# Patient Record
Sex: Male | Born: 1950 | Race: White | Hispanic: No | Marital: Married | Smoking: Never smoker
Health system: Southern US, Community
[De-identification: ages and names within clinical notes are randomized; demographics above are authoritative.]

## PROBLEM LIST (undated history)

## (undated) DIAGNOSIS — E7800 Pure hypercholesterolemia, unspecified: Secondary | ICD-10-CM

## (undated) DIAGNOSIS — I4891 Unspecified atrial fibrillation: Secondary | ICD-10-CM

## (undated) DIAGNOSIS — I2699 Other pulmonary embolism without acute cor pulmonale: Secondary | ICD-10-CM

## (undated) DIAGNOSIS — M109 Gout, unspecified: Secondary | ICD-10-CM

## (undated) DIAGNOSIS — E119 Type 2 diabetes mellitus without complications: Secondary | ICD-10-CM

## (undated) DIAGNOSIS — I10 Essential (primary) hypertension: Secondary | ICD-10-CM

## (undated) DIAGNOSIS — G629 Polyneuropathy, unspecified: Secondary | ICD-10-CM

## (undated) HISTORY — PX: CARDIAC SURGERY: SHX584

---

## 2017-10-29 ENCOUNTER — Ambulatory Visit (INDEPENDENT_AMBULATORY_CARE_PROVIDER_SITE_OTHER): Payer: Medicare HMO

## 2017-10-29 ENCOUNTER — Ambulatory Visit
Admission: EM | Admit: 2017-10-29 | Discharge: 2017-10-29 | Disposition: A | Discharge status: Home / Self Care | Payer: Medicare HMO | Attending: Emergency Medicine | Admitting: Emergency Medicine

## 2017-10-29 ENCOUNTER — Other Ambulatory Visit: Payer: Self-pay

## 2017-10-29 ENCOUNTER — Encounter: Payer: Self-pay | Admitting: Emergency Medicine

## 2017-10-29 DIAGNOSIS — R05 Cough: Secondary | ICD-10-CM

## 2017-10-29 DIAGNOSIS — J22 Unspecified acute lower respiratory infection: Secondary | ICD-10-CM | POA: Diagnosis not present

## 2017-10-29 HISTORY — DX: Other pulmonary embolism without acute cor pulmonale: I26.99

## 2017-10-29 HISTORY — DX: Gout, unspecified: M10.9

## 2017-10-29 HISTORY — DX: Polyneuropathy, unspecified: G62.9

## 2017-10-29 HISTORY — DX: Essential (primary) hypertension: I10

## 2017-10-29 HISTORY — DX: Unspecified atrial fibrillation: I48.91

## 2017-10-29 HISTORY — DX: Pure hypercholesterolemia, unspecified: E78.00

## 2017-10-29 HISTORY — DX: Type 2 diabetes mellitus without complications: E11.9

## 2017-10-29 MED ORDER — HYDROCOD POLST-CPM POLST ER 10-8 MG/5ML PO SUER: 5.0000 mL | Freq: Two times a day (BID) | ORAL | 0 refills | Status: AC | PRN

## 2017-10-29 MED ORDER — BENZONATATE 200 MG PO CAPS: 200.0000 mg | ORAL_CAPSULE | Freq: Three times a day (TID) | ORAL | 0 refills | Status: AC | PRN

## 2017-10-29 MED ORDER — AEROCHAMBER PLUS MISC: 2 refills | Status: AC

## 2017-10-29 MED ORDER — ALBUTEROL SULFATE HFA 108 (90 BASE) MCG/ACT IN AERS: 2.0000 | INHALATION_SPRAY | RESPIRATORY_TRACT | 0 refills | Status: AC | PRN

## 2017-10-29 MED ORDER — DOXYCYCLINE HYCLATE 100 MG PO CAPS: 100.0000 mg | ORAL_CAPSULE | Freq: Two times a day (BID) | ORAL | 0 refills | Status: AC

## 2017-10-29 NOTE — ED Provider Notes (Signed)
HPI  SUBJECTIVE:  Tim Roberts is a 66 y.o. male who presents with a cold and a nonproductive cough in the past 7-10 days.  Patient states that he cannot get the phlegm out.  Reports chills, but no documented fevers.  Some wheezing.  He reports shortness of breath with coughing only and chest congestion.  No nasal congestion, rhinorrhea, postnasal drip, sinus pain or pressure.  No chest pain.  No dyspnea on exertion.  No posttussive emesis.  No calf pain, new or different leg swelling.  No PND.  No orthopnea, nocturia.  States he is waking up at night coughing.  He has tried saline nasal spray, Tylenol, DayQuil and Delsym without improvement in symptoms.  No aggravating factors.  He is currently being treated for shingles on his right torso. No  other antibiotics in the past month.  He took Tylenol within 6-8 hours of evaluation.  He has a past medical history of provoked DVT/PE, diabetes, hypertension, atrial fibrillation, hypercholesterolemia.  States that he is compliant with his warfarin.  No history of CHF, asthma, eczema, COPD, smoking.  PMD: CP   Past Medical History:  Diagnosis Date  . Atrial fibrillation (HCC)   . Diabetes mellitus without complication (HCC)   . Gout   . Hypercholesteremia   . Hypertension   . Neuropathy   . Pulmonary embolism [facility])     Past Surgical History:  Procedure Laterality Date  . CARDIAC SURGERY      History reviewed. No pertinent family history.  Social History   Tobacco Use  . Smoking status: Never Smoker  . Smokeless tobacco: Never Used  Substance Use Topics  . Alcohol use: Yes    Frequency: Never  . Drug use: No    No current facility-administered medications for this encounter.   Current Outpatient Medications:  .  amiodarone (PACERONE) 100 MG tablet, Take 100 mg by mouth 2 (two) times daily., Disp: , Rfl:  .  aspirin EC 81 MG tablet, Take 81 mg by mouth daily., Disp: , Rfl:  .  atorvastatin (LIPITOR) 20 MG tablet, Take 20 mg by mouth  daily., Disp: , Rfl:  .  b complex vitamins capsule, Take 1 capsule by mouth daily., Disp: , Rfl:  .  Cholecalciferol (VITAMIN D3) 5000 units CAPS, Take 1 capsule by mouth daily., Disp: , Rfl:  .  colchicine 0.6 MG tablet, Take 0.6 mg by mouth 3 (three) times daily., Disp: , Rfl:  .  furosemide (LASIX) 40 MG tablet, Take 40 mg by mouth daily., Disp: , Rfl:  .  gabapentin (NEURONTIN) 400 MG capsule, Take 400 mg by mouth 3 (three) times daily., Disp: , Rfl:  .  metoprolol tartrate (LOPRESSOR) 50 MG tablet, Take 50 mg by mouth 2 (two) times daily., Disp: , Rfl:  .  Omega-3 Fatty Acids (FISH OIL) 1200 MG CAPS, Take 1 capsule by mouth 2 (two) times daily., Disp: , Rfl:  .  warfarin (COUMADIN) 2 MG tablet, Take 2 mg by mouth as directed., Disp: , Rfl:  .  albuterol (PROVENTIL HFA;VENTOLIN HFA) 108 (90 Base) MCG/ACT inhaler, Inhale 2 puffs into the lungs every 4 (four) hours as needed for wheezing or shortness of breath. Dispense with aerochamber, Disp: 1 Inhaler, Rfl: 0 .  benzonatate (TESSALON) 200 MG capsule, Take 1 capsule (200 mg total) by mouth 3 (three) times daily as needed for cough., Disp: 30 capsule, Rfl: 0 .  chlorpheniramine-HYDROcodone (TUSSIONEX PENNKINETIC ER) 10-8 MG/5ML SUER, Take 5 mLs by mouth every  12 (twelve) hours as needed for cough., Disp: 120 mL, Rfl: 0 .  doxycycline (VIBRAMYCIN) 100 MG capsule, Take 1 capsule (100 mg total) by mouth 2 (two) times daily for 7 days., Disp: 14 capsule, Rfl: 0 .  Spacer/Aero-Holding Chambers (AEROCHAMBER PLUS) inhaler, Use as instructed, Disp: 1 each, Rfl: 2  Allergies  Allergen Reactions  . Penicillins Shortness Of Breath     ROS  As noted in HPI.   Physical Exam  BP (!) 149/61 (BP Location: Left Arm)   Pulse 61   Temp 98.3 F (36.8 C) (Oral)   Resp 16   Ht 5\' 9"  (1.753 m)   Wt 290 lb (131.5 kg)   SpO2 97%   BMI 42.83 kg/m   Constitutional: Well developed, well nourished, no acute distress Eyes:  EOMI, conjunctiva normal  bilaterally HENT: Normocephalic, atraumatic,mucus membranes moist.  Positive mucoid nasal congestion.  No sinus tenderness.  Normal turbinates.  No postnasal drip or cobblestoning. Respiratory: Normal inspiratory effort, lungs clear bilaterally.  Good air movement.   Cardiovascular: Normal rate, regular rhythm, no murmurs, rubs, gallops GI: nondistended skin: No rash, skin intact Musculoskeletal: no deformities.  Trace edema calves bilaterally Neurologic: Alert & oriented x 3, no focal neuro deficits Psychiatric: Speech and behavior appropriate   ED Course   Medications - No data to display  Orders Placed This Encounter  Procedures  . DG Chest 2 View    Standing Status:   Standing    Number of Occurrences:   1    Order Specific Question:   Reason for Exam (SYMPTOM  OR DIAGNOSIS REQUIRED)    Answer:   cough x 10 days r/o PNA, pulm edema pleural effusion    No results found for this or any previous visit (from the past 24 hour(s)). Dg Chest 2 View  Result Date: 10/29/2017 CLINICAL DATA:  Dry cough and chest congestion for 1 week. EXAM: CHEST  2 VIEW COMPARISON:  None. FINDINGS: Mild elevation of the right hemidiaphragm relative to the left is noted. Lungs are clear. Heart size is normal. No pneumothorax or pleural effusion. No acute bony abnormality. IMPRESSION: No acute disease. Electronically Signed   By: Drusilla Kannerhomas  Dalessio M.D.   On: 10/29/2017 11:51    ED Clinical Impression  LRTI (lower respiratory tract infection)   ED Assessment/Plan   Reviewed imaging independently.  No pneumonia.  See radiology report for full details.  Presentation most consistent with lower respiratory tract infection.  He does not have pneumonia on x-ray and there are no focal lung findings however given duration of symptoms and the fact that he has multiple comorbidities, will send home on doxycycline, Tussionex, Tessalon, albuterol inhaler 1-2 puffs every 4-6 hours using a spacer.  He will need to  follow-up with his primary care physician in several days to make sure that he is getting better.  He is to go to the ER if he gets worse  Discussed  imaging, MDM, plan and followup with patient. Discussed sn/sx that should prompt return to the ED. patient agrees with plan.   Meds ordered this encounter  Medications  . albuterol (PROVENTIL HFA;VENTOLIN HFA) 108 (90 Base) MCG/ACT inhaler    Sig: Inhale 2 puffs into the lungs every 4 (four) hours as needed for wheezing or shortness of breath. Dispense with aerochamber    Dispense:  1 Inhaler    Refill:  0  . benzonatate (TESSALON) 200 MG capsule    Sig: Take 1 capsule (200 mg total)  by mouth 3 (three) times daily as needed for cough.    Dispense:  30 capsule    Refill:  0  . chlorpheniramine-HYDROcodone (TUSSIONEX PENNKINETIC ER) 10-8 MG/5ML SUER    Sig: Take 5 mLs by mouth every 12 (twelve) hours as needed for cough.    Dispense:  120 mL    Refill:  0  . Spacer/Aero-Holding Chambers (AEROCHAMBER PLUS) inhaler    Sig: Use as instructed    Dispense:  1 each    Refill:  2  . doxycycline (VIBRAMYCIN) 100 MG capsule    Sig: Take 1 capsule (100 mg total) by mouth 2 (two) times daily for 7 days.    Dispense:  14 capsule    Refill:  0    *This clinic note was created using Scientist, clinical (histocompatibility and immunogenetics)Dragon dictation software. Therefore, there may be occasional mistakes despite careful proofreading.   ?   Domenick GongMortenson, Clemence Stillings, MD 10/29/17 1743

## 2017-10-29 NOTE — Discharge Instructions (Signed)
Finish the doxycycline is told to stop by her primary care provider, take the Tussionex for cough at night, Tessalon during the day.  Take 1-2 puffs from your albuterol inhaler using a spacer every 4-6 hours.

## 2017-10-29 NOTE — ED Triage Notes (Signed)
Patient  c/o cough and chest congestion for over a week.   

## 2020-07-31 ENCOUNTER — Ambulatory Visit
Admission: EM | Admit: 2020-07-31 | Discharge: 2020-07-31 | Disposition: A | Discharge status: Home / Self Care | Payer: Medicare HMO | Attending: Physician Assistant | Admitting: Physician Assistant

## 2020-07-31 ENCOUNTER — Ambulatory Visit (INDEPENDENT_AMBULATORY_CARE_PROVIDER_SITE_OTHER): Payer: Medicare HMO

## 2020-07-31 DIAGNOSIS — L539 Erythematous condition, unspecified: Secondary | ICD-10-CM | POA: Diagnosis not present

## 2020-07-31 DIAGNOSIS — L538 Other specified erythematous conditions: Secondary | ICD-10-CM

## 2020-07-31 DIAGNOSIS — Z8669 Personal history of other diseases of the nervous system and sense organs: Secondary | ICD-10-CM

## 2020-07-31 DIAGNOSIS — L03032 Cellulitis of left toe: Secondary | ICD-10-CM

## 2020-07-31 DIAGNOSIS — M7989 Other specified soft tissue disorders: Secondary | ICD-10-CM | POA: Diagnosis not present

## 2020-07-31 MED ORDER — DOXYCYCLINE HYCLATE 100 MG PO CAPS: 100.0000 mg | ORAL_CAPSULE | Freq: Two times a day (BID) | ORAL | 0 refills | Status: AC

## 2020-07-31 NOTE — ED Provider Notes (Signed)
[facility]    CSN: [id number] Arrival date & time: 07/31/20  1225      History   Chief Complaint Chief Complaint  Patient presents with  . Toe Injury    Left foot, 2nd toe redness.    HPI Tim Roberts is a 69 y.o. male with a history of type 2 diabetes, gout, atrial fibrillation, hypertension, peripheral neuropathy, pulmonary embolism on long-term anticoagulant use presents for swelling and redness of the second toe left foot.  He says that his wife was looking at his feet and noticed that he had swelling and redness of the toe yesterday.  He says he does not really have much feeling in the toe and he is not sure what happened.  He denies known injury. He says a looking there is a blister at the tip.  He denies any drainage or bleeding.  He denies any fevers or chills.  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  He has no other concerns today.  HPI  Past Medical History:  Diagnosis Date  . Atrial fibrillation (HCC)   . Diabetes mellitus without complication (HCC)   . Gout   . Hypercholesteremia   . Hypertension   . Neuropathy   . Pulmonary embolism (HCC)     There are no problems to display for this patient.   Past Surgical History:  Procedure Laterality Date  . CARDIAC SURGERY         Home Medications    Prior to Admission medications   Medication Sig Start Date End Date Taking? Authorizing Provider  albuterol (PROVENTIL HFA;VENTOLIN HFA) 108 (90 Base) MCG/ACT inhaler Inhale 2 puffs into the lungs every 4 (four) hours as needed for wheezing or shortness of breath. Dispense with aerochamber 10/29/17  Yes Domenick Gong, MD  amiodarone (PACERONE) 100 MG tablet Take 100 mg by mouth 2 (two) times daily.   Yes [provider]  aspirin EC 81 MG tablet Take 81 mg by mouth daily.   Yes [provider]  atorvastatin (LIPITOR) 20 MG tablet Take 20 mg by mouth daily.   Yes [provider]  b complex vitamins capsule Take 1 capsule by  mouth daily.   Yes [provider]  benzonatate (TESSALON) 200 MG capsule Take 1 capsule (200 mg total) by mouth 3 (three) times daily as needed for cough. 10/29/17  Yes Domenick Gong, MD  Cholecalciferol (VITAMIN D3) 5000 units CAPS Take 1 capsule by mouth daily.   Yes [provider]  colchicine 0.6 MG tablet Take 0.6 mg by mouth 3 (three) times daily.   Yes [provider]  furosemide (LASIX) 40 MG tablet Take 40 mg by mouth daily.   Yes [provider]  gabapentin (NEURONTIN) 400 MG capsule Take 400 mg by mouth 3 (three) times daily.   Yes [provider]  metoprolol tartrate (LOPRESSOR) 50 MG tablet Take 50 mg by mouth 2 (two) times daily.   Yes [provider]  nitroGLYCERIN (NITROSTAT) 0.4 MG SL tablet Place under the tongue. 11/28/17  Yes [provider]  Omega-3 Fatty Acids (FISH OIL) 1200 MG CAPS Take 1 capsule by mouth 2 (two) times daily.   Yes [provider]  Spacer/Aero-Holding Chambers (AEROCHAMBER PLUS) inhaler Use as instructed 10/29/17  Yes Domenick Gong, MD  warfarin (COUMADIN) 2 MG tablet Take 2 mg by mouth as directed.   Yes [provider]  chlorpheniramine-HYDROcodone (TUSSIONEX PENNKINETIC ER) 10-8 MG/5ML SUER Take 5 mLs by mouth every  12 (twelve) hours as needed for cough. 10/29/17   Domenick Gong, MD  doxycycline (VIBRAMYCIN) 100 MG capsule Take 1 capsule (100 mg total) by mouth 2 (two) times daily for 10 days. 07/31/20 08/10/20  Shirlee Latch, PA-C    Family History History reviewed. No pertinent family history.  Social History Social History   Tobacco Use  . Smoking status: Never Smoker  . Smokeless tobacco: Never Used  Vaping Use  . Vaping Use: Never used  Substance Use Topics  . Alcohol use: Yes  . Drug use: No     Allergies   Penicillins   Review of Systems Review of Systems  Constitutional: Negative for fatigue and fever.  Musculoskeletal: Positive for  gait problem (chronic condition) and joint swelling. Negative for arthralgias.  Skin: Positive for color change. Negative for rash and wound.  Neurological: Positive for numbness. Negative for weakness.  Hematological: Bruises/bleeds easily.     Physical Exam Triage Vital Signs ED Triage Vitals  Enc Vitals Group     BP 07/31/20 1240 139/64     Pulse Rate 07/31/20 1240 (!) 50     Resp 07/31/20 1240 18     Temp 07/31/20 1240 98.6 F (37 C)     Temp Source 07/31/20 1240 Oral     SpO2 07/31/20 1240 95 %     Weight 07/31/20 1236 299 lb (135.6 kg)     Height 07/31/20 1236 5\' 8"  (1.727 m)     Head Circumference --      Peak Flow --      Pain Score 07/31/20 1236 0     Pain Loc --      Pain Edu? --      Excl. in GC? --    No data found.  Updated Vital Signs BP 139/64 (BP Location: Left Arm)   Pulse (!) 50   Temp 98.6 F (37 C) (Oral)   Resp 18   Ht 5\' 8"  (1.727 m)   Wt 299 lb (135.6 kg)   SpO2 95%   BMI 45.46 kg/m       Physical Exam Vitals and nursing note reviewed.  Constitutional:      General: He is not in acute distress.    Appearance: He is well-developed. He is obese. He is not ill-appearing or toxic-appearing.  HENT:     Head: Normocephalic and atraumatic.  Eyes:     General: No scleral icterus.    Conjunctiva/sclera: Conjunctivae normal.  Cardiovascular:     Rate and Rhythm: Regular rhythm. Bradycardia present.     Pulses: Normal pulses.     Heart sounds: Normal heart sounds.  Pulmonary:     Effort: Pulmonary effort is normal. No respiratory distress.     Breath sounds: Normal breath sounds.  Musculoskeletal:     Cervical back: Neck supple.     Left foot: Normal range of motion and normal capillary refill. Swelling (moderate swelling and erythema of second digit. there is a fluid filled vesicle of the distal toe) present. No tenderness or bony tenderness. Normal pulse.  Skin:    General: Skin is warm and dry.  Neurological:     General: No focal  deficit present.     Mental Status: He is alert. Mental status is at baseline.     Motor: No weakness.     Gait: Gait abnormal (walks with cane).  Psychiatric:        Mood and Affect: Mood normal.  Behavior: Behavior normal.        Thought Content: Thought content normal.      UC Treatments / Results  Labs (all labs ordered are listed, but only abnormal results are displayed) Labs Reviewed  AEROBIC/ANAEROBIC CULTURE (SURGICAL/DEEP WOUND)    EKG   Radiology DG Toe 2nd Left  Result Date: 07/31/2020 CLINICAL DATA:  Left second toe redness.  History of neuropathy. EXAM: LEFT SECOND TOE COMPARISON:  No prior. FINDINGS: Soft tissue swelling noted over the left second digit. Small amount of subcutaneous air noted. Soft tissue infection could present this fashion. No radiopaque foreign body. Small lucency noted in the distal aspect of the middle phalanx of the left second digit. Although a focal small cystic lesion could present in this fashion a focal area of osteomyelitis cannot be completely excluded. Prominent degenerative changes noted about the left first metatarsophalangeal joint. Prominent erosive changes are noted about the distal aspect of the left first metatarsal and proximal aspect of the proximal phalanx of the left first digit. Inflammatory arthropathy including gout could present in this fashion. Septic arthritis/osteomyelitis cannot be excluded. No evidence of fracture or dislocation. IMPRESSION: 1. Soft tissue swelling noted over the left second digit. Small amount of subcutaneous air noted. Soft tissue infection could present this fashion. No radiopaque foreign body. Small lucency noted the distal aspect of the middle phalanx of the left second digit. Although a focal small cystic lesion could present this fashion a focal area of osteomyelitis cannot be excluded. 2. Prominent degenerative changes noted about the left first metatarsophalangeal joint. Prominent erosive changes  noted about the distal aspect of the left first metatarsal and proximal aspect of the proximal phalanx of the left first digit. An inflammatory arthropathy including gout could present in this fashion. Septic arthritis/osteomyelitis cannot be excluded. Electronically Signed   By: Maisie Fus  Register   On: 07/31/2020 13:23    Procedures Incision and Drainage  Date/Time: 07/31/2020 1:16 PM Performed by: Shirlee Latch, PA-C Authorized by: Shirlee Latch, PA-C   Consent:    Consent obtained:  Verbal   Consent given by:  Patient   Risks discussed:  Bleeding, infection, incomplete drainage and pain   Alternatives discussed:  Alternative treatment, delayed treatment and observation Location:    Type:  Fluid collection   Size:  8 mm   Location:  Lower extremity   Lower extremity location:  Toe   Toe location:  L second toe Pre-procedure details:    Skin preparation:  Chloraprep Anesthesia (see MAR for exact dosages):    Anesthesia method:  None Procedure type:    Complexity:  Complex Procedure details:    Needle aspiration: no     Incision types:  Single straight (18 G needle)   Incision depth:  Dermal   Drainage:  Purulent and bloody   Drainage amount:  Scant   Wound treatment:  Wound left open   Packing materials:  None Post-procedure details:    Patient tolerance of procedure:  Tolerated well, no immediate complications   (including critical care time)  Medications Ordered in UC Medications - No data to display  Initial Impression / Assessment and Plan / UC Course  I have reviewed the triage vital signs and the nursing notes.  Pertinent labs & imaging results that were available during my care of the patient were reviewed by me and considered in my medical decision making (see chart for details).    69 year old male presenting for swelling and redness of the  second toe left foot.  He admits he does not have much sensation in the toe and cannot really decipher pain.  Imaging  of the toe obtained to assess for possible underlying fracture.  Imaging independently reviewed by me.  Imaging negative for fracture.  Radiologist over read questions focal cyst versus osteomyelitis of the left 2nd digit.  There is also mention of possible septic arthritis/osteomyelitis of the 1st MTP, but there is no swelling or abnormality at this area on exam.  Discussed results with patient.  I was able to drain a small amount of bloody and purulent fluid from the blister on the toe.  This was taken for culture.  Treating for cellulitis of the toe with doxycycline since he has a history of MRSA.  Patient also has penicillin allergy.  Advised him to keep a close eye on his INR is as doxycycline can affect them.  He should follow-up with PCP or podiatrist in a couple days for recheck or our office.  He should follow-up in ED sooner for any fever, chills, pain, increased redness or swelling.  Patient understanding and agreeable.  *I discussed this patient case with Dr. Leonides Grills.  Dr. Leonides Grills agreeable that okay to treat with doxycycline outpatient at this time.  Patient given strict ED precautions and advised close follow-up with either podiatry or PCP next week for repeat imaging.  Low suspicion for osteomyelitis and most likely this is a cyst given his chronic inflammatory changes due to gout and neuropathy already.  However, I did speak with the patient about the potential for deeper infection and advised him to closely follow-up with specialist or ED if symptoms worsen.  Final Clinical Impressions(s) / UC Diagnoses   Final diagnoses:  Cellulitis of second toe of left foot     Discharge Instructions     There is no infection within the toe.  You should start doxycycline and finish the full course.  X-ray results are below.  Please show this to the podiatrist or PCP that you follow-up with next week.  There is some possible concern for deeper infection affecting the bone, so you will need to  follow-up with either podiatry or PCP next week for a recheck.  If at any point you develop fever, chills, increased swelling or redness of the toe, red streaks up the foot, etc. you should go immediately to the emergency department because you will likely need IV antibiotics.  XRAY Toe 2nd Left  Result Date: 07/31/2020 CLINICAL DATA:  Left second toe redness.  History of neuropathy. EXAM: LEFT SECOND TOE COMPARISON:  No prior. FINDINGS: Soft tissue swelling noted over the left second digit. Small amount of subcutaneous air noted. Soft tissue infection could present this fashion. No radiopaque foreign body. Small lucency noted in the distal aspect of the middle phalanx of the left second digit. Although a focal small cystic lesion could present in this fashion a focal area of osteomyelitis cannot be completely excluded. Prominent degenerative changes noted about the left first metatarsophalangeal joint. Prominent erosive changes are noted about the distal aspect of the left first metatarsal and proximal aspect of the proximal phalanx of the left first digit. Inflammatory arthropathy including gout could present in this fashion. Septic arthritis/osteomyelitis cannot be excluded. No evidence of fracture or dislocation. IMPRESSION: 1. Soft tissue swelling noted over the left second digit. Small amount of subcutaneous air noted. Soft tissue infection could present this fashion. No radiopaque foreign body. Small lucency noted the distal aspect of the  middle phalanx of the left second digit. Although a focal small cystic lesion could present this fashion a focal area of osteomyelitis cannot be excluded. 2. Prominent degenerative changes noted about the left first metatarsophalangeal joint. Prominent erosive changes noted about the distal aspect of the left first metatarsal and proximal aspect of the proximal phalanx of the left first digit. An inflammatory arthropathy including gout could present in this fashion.  Septic arthritis/osteomyelitis cannot be excluded. Electronically Signed   By: Maisie Fushomas  Register   On: 07/31/2020 13:23    [facility] [address]  [phone]    ED Prescriptions    Medication Sig Dispense Auth. Provider   doxycycline (VIBRAMYCIN) 100 MG capsule Take 1 capsule (100 mg total) by mouth 2 (two) times daily for 10 days. 20 capsule Shirlee LatchEaves, Elijah Michaelis B, PA-C     PDMP not reviewed this encounter.   Shirlee Latchaves, Braden Cimo B, PA-C 07/31/20 1357

## 2020-07-31 NOTE — ED Triage Notes (Signed)
Patient in today w/ left foot, 2nd toe redness. Patient states he does not recall injury but also says he has neuropathy and does not feel much in his feet. Patient's wife noticed the redness last evening.

## 2020-07-31 NOTE — Discharge Instructions (Addendum)
There is no infection within the toe.  You should start doxycycline and finish the full course.  X-ray results are below.  Please show this to the podiatrist or PCP that you follow-up with next week.  There is some possible concern for deeper infection affecting the bone, so you will need to follow-up with either podiatry or PCP next week for a recheck.  If at any point you develop fever, chills, increased swelling or redness of the toe, red streaks up the foot, etc. you should go immediately to the emergency department because you will likely need IV antibiotics.  XRAY Toe 2nd Left  Result Date: 07/31/2020 CLINICAL DATA:  Left second toe redness.  History of neuropathy. EXAM: LEFT SECOND TOE COMPARISON:  No prior. FINDINGS: Soft tissue swelling noted over the left second digit. Small amount of subcutaneous air noted. Soft tissue infection could present this fashion. No radiopaque foreign body. Small lucency noted in the distal aspect of the middle phalanx of the left second digit. Although a focal small cystic lesion could present in this fashion a focal area of osteomyelitis cannot be completely excluded. Prominent degenerative changes noted about the left first metatarsophalangeal joint. Prominent erosive changes are noted about the distal aspect of the left first metatarsal and proximal aspect of the proximal phalanx of the left first digit. Inflammatory arthropathy including gout could present in this fashion. Septic arthritis/osteomyelitis cannot be excluded. No evidence of fracture or dislocation. IMPRESSION: 1. Soft tissue swelling noted over the left second digit. Small amount of subcutaneous air noted. Soft tissue infection could present this fashion. No radiopaque foreign body. Small lucency noted the distal aspect of the middle phalanx of the left second digit. Although a focal small cystic lesion could present this fashion a focal area of osteomyelitis cannot be excluded. 2. Prominent degenerative  changes noted about the left first metatarsophalangeal joint. Prominent erosive changes noted about the distal aspect of the left first metatarsal and proximal aspect of the proximal phalanx of the left first digit. An inflammatory arthropathy including gout could present in this fashion. Septic arthritis/osteomyelitis cannot be excluded. Electronically Signed   By: Maisie Fus  Register   On: 07/31/2020 13:23    [facility] [address]  [phone]

## 2020-08-05 LAB — AEROBIC/ANAEROBIC CULTURE W GRAM STAIN (SURGICAL/DEEP WOUND): Culture: NO GROWTH

## 2021-11-13 IMAGING — CR DG TOE 2ND 2+V*L*
3 series · 3 of 3 positions shown · non-contrast
Comparison: No prior.

CLINICAL DATA: Left second toe redness.  History of neuropathy.

EXAM:
LEFT SECOND TOE

[toe ap]
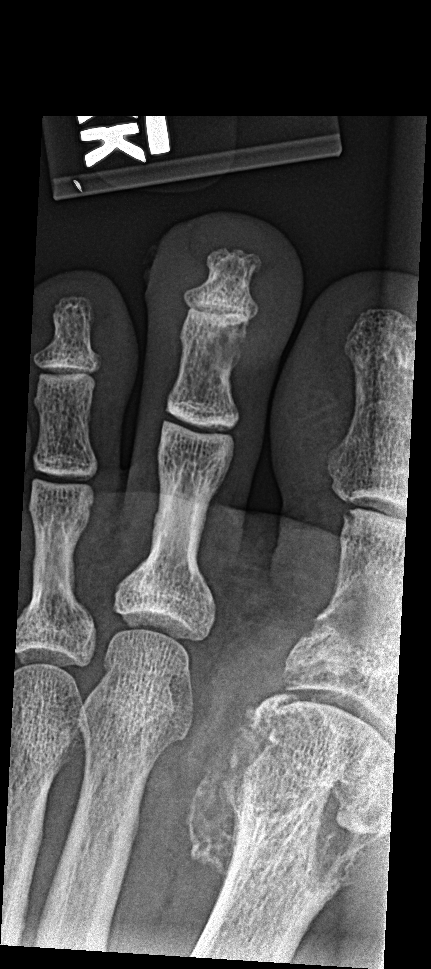

[toe obl]
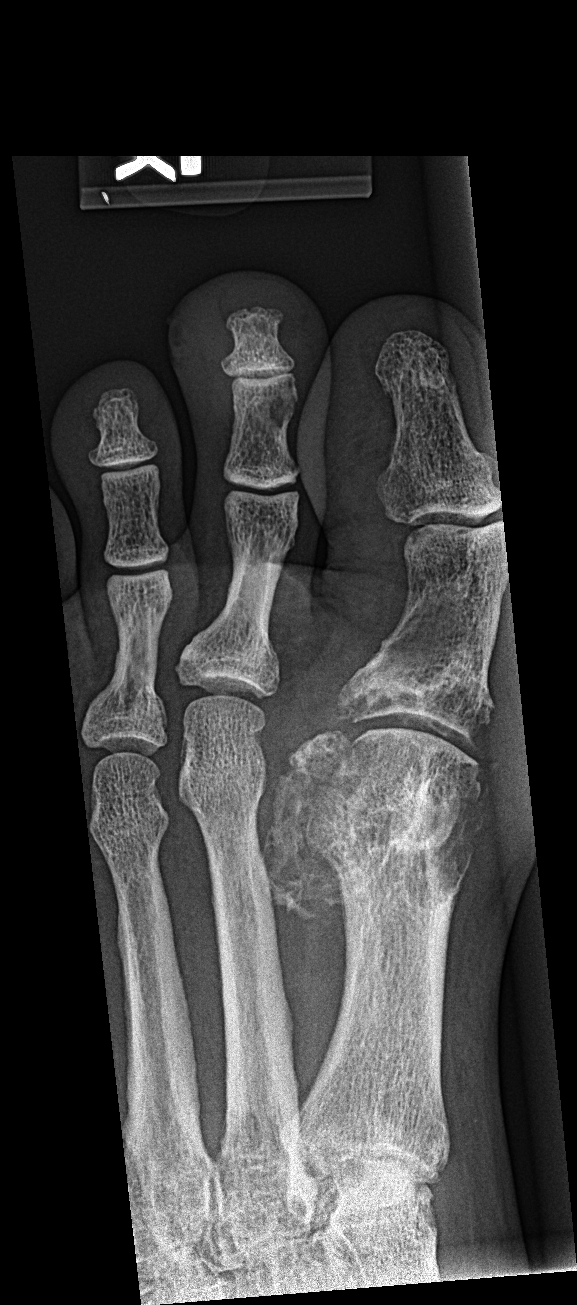

[toe lat]
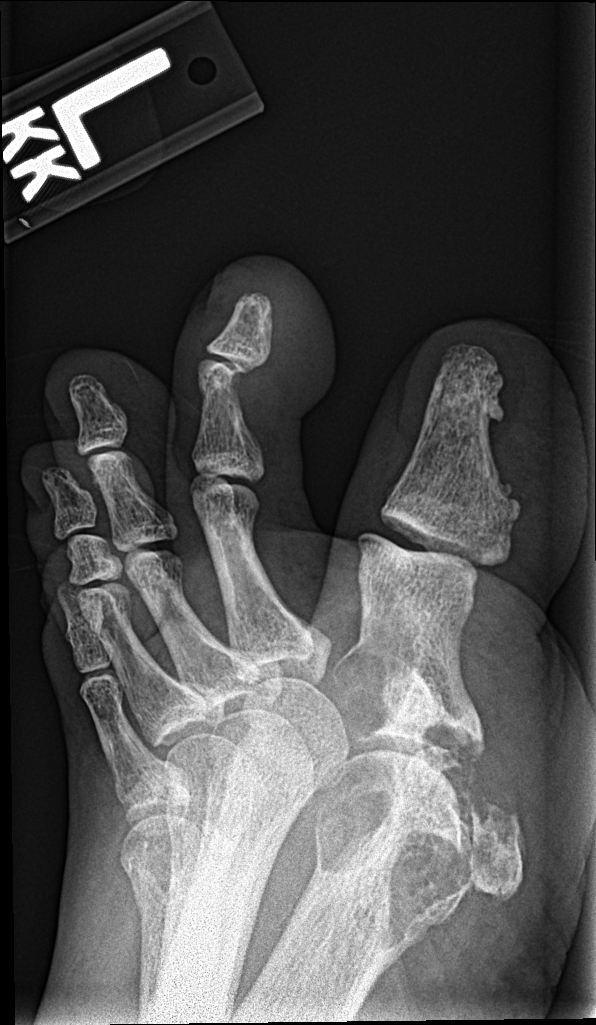

[3 of 3 positions shown; findings below may reference images not displayed]

FINDINGS: Soft tissue swelling noted over the left second digit. Small amount
of subcutaneous air noted. Soft tissue infection could present this
fashion. No radiopaque foreign body. Small lucency noted in the
distal aspect of the middle phalanx of the left second digit.
Although a focal small cystic lesion could present in this fashion a
focal area of osteomyelitis cannot be completely excluded.

Prominent degenerative changes noted about the left first
metatarsophalangeal joint. Prominent erosive changes are noted about
the distal aspect of the left first metatarsal and proximal aspect
of the proximal phalanx of the left first digit. Inflammatory
arthropathy including gout could present in this fashion. Septic
arthritis/osteomyelitis cannot be excluded.

No evidence of fracture or dislocation.
IMPRESSION: 1. Soft tissue swelling noted over the left second digit. Small
amount of subcutaneous air noted. Soft tissue infection could
present this fashion. No radiopaque foreign body. Small lucency
noted the distal aspect of the middle phalanx of the left second
digit. Although a focal small cystic lesion could present this
fashion a focal area of osteomyelitis cannot be excluded.

2. Prominent degenerative changes noted about the left first
metatarsophalangeal joint. Prominent erosive changes noted about the
distal aspect of the left first metatarsal and proximal aspect of
the proximal phalanx of the left first digit. An inflammatory
arthropathy including gout could present in this fashion. Septic
arthritis/osteomyelitis cannot be excluded.

## 2023-11-03 ENCOUNTER — Ambulatory Visit
Admission: EM | Admit: 2023-11-03 | Discharge: 2023-11-03 | Disposition: A | Discharge status: Home / Self Care | Payer: Medicare HMO | Attending: Emergency Medicine | Admitting: Emergency Medicine

## 2023-11-03 ENCOUNTER — Encounter: Payer: Self-pay | Admitting: Emergency Medicine

## 2023-11-03 DIAGNOSIS — Z203 Contact with and (suspected) exposure to rabies: Secondary | ICD-10-CM

## 2023-11-03 DIAGNOSIS — S91109A Unspecified open wound of unspecified toe(s) without damage to nail, initial encounter: Secondary | ICD-10-CM | POA: Diagnosis not present

## 2023-11-03 MED ORDER — DOXYCYCLINE HYCLATE 100 MG PO CAPS: 100.0000 mg | ORAL_CAPSULE | Freq: Two times a day (BID) | ORAL | 0 refills | Status: AC

## 2023-11-03 MED ORDER — TETANUS-DIPHTH-ACELL PERTUSSIS 5-2.5-18.5 LF-MCG/0.5 IM SUSY
0.5000 mL | PREFILLED_SYRINGE | Freq: Once | INTRAMUSCULAR | Status: AC
  Administered 2023-11-03: 0.5 mL via INTRAMUSCULAR

## 2023-11-03 NOTE — ED Triage Notes (Signed)
Patient states that he clipped his nail on his left big toe last night and states that his toe started bleeding.  Patient states that he has been able to get it to stop bleeding.  Patient is currently on a blood thinner.

## 2023-11-03 NOTE — Discharge Instructions (Addendum)
Take the doxycycline twice daily with food for 5 days to prevent infection from your toe injury.  Monitor your INR at home and if you wind up supratherapeutic stop taking the doxycycline and contact your primary care provider.  Keep the pressure dressing in place for the next 24 hours.  You may then remove the dressing carefully, leaving the Gelfoam in place, wash your toe gently, and apply a new nonstick dressing.  Keep your left foot elevated is much as possible and avoid weightbearing is much as possible to prevent further bleeding.  If you have a return of significant bleeding through the existing pressure dressing you need to go to the ER for evaluation.  If you develop any increased redness at the site of your toe, red streaks going up your toe, pus drainage, or fevers you need to either return for reevaluation or seek care in the ER.

## 2023-11-03 NOTE — ED Provider Notes (Signed)
[facility]    CSN: [id number] Arrival date & time: 11/03/23  1015      History   Chief Complaint Chief Complaint  Patient presents with   Toe Injury    Left big toe    HPI Tim Roberts is a 72 y.o. male.   HPI  72 year old male with a past medical history significant for PE, neuropathy, hypertension, hypercholesterolemia, diabetes, gout, and atrial fibrillation on Coumadin presents for evaluation of left great toe bleeding.  He reports that he went to clip his toenails last night and realized that he clipped the end of his toe.  He has a dressing in place but he has been unable to stop the bleeding.  Patient is unsure of his last tetanus shot.  Past Medical History:  Diagnosis Date   Atrial fibrillation (HCC)    Diabetes mellitus without complication (HCC)    Gout    Hypercholesteremia    Hypertension    Neuropathy    Pulmonary embolism (HCC)     There are no active problems to display for this patient.   Past Surgical History:  Procedure Laterality Date   CARDIAC SURGERY         Home Medications    Prior to Admission medications   Medication Sig Start Date End Date Taking? Authorizing Provider  doxycycline (VIBRAMYCIN) 100 MG capsule Take 1 capsule (100 mg total) by mouth 2 (two) times daily for 5 days. 11/03/23 11/08/23 Yes Becky Augusta, NP  albuterol (PROVENTIL HFA;VENTOLIN HFA) 108 (90 Base) MCG/ACT inhaler Inhale 2 puffs into the lungs every 4 (four) hours as needed for wheezing or shortness of breath. Dispense with aerochamber 10/29/17   Domenick Gong, MD  amiodarone (PACERONE) 100 MG tablet Take 100 mg by mouth 2 (two) times daily.    [provider]  aspirin EC 81 MG tablet Take 81 mg by mouth daily.    [provider]  atorvastatin (LIPITOR) 20 MG tablet Take 20 mg by mouth daily.    [provider]  b complex vitamins capsule Take 1 capsule by mouth daily.    [provider]  benzonatate (TESSALON)  200 MG capsule Take 1 capsule (200 mg total) by mouth 3 (three) times daily as needed for cough. 10/29/17   Domenick Gong, MD  chlorpheniramine-HYDROcodone [facility] PENNKINETIC ER) 10-8 MG/5ML SUER Take 5 mLs by mouth every 12 (twelve) hours as needed for cough. 10/29/17   Domenick Gong, MD  Cholecalciferol (VITAMIN D3) 5000 units CAPS Take 1 capsule by mouth daily.    [provider]  colchicine 0.6 MG tablet Take 0.6 mg by mouth 3 (three) times daily.    [provider]  furosemide (LASIX) 40 MG tablet Take 40 mg by mouth daily.    [provider]  gabapentin (NEURONTIN) 400 MG capsule Take 400 mg by mouth 3 (three) times daily.    [provider]  metoprolol tartrate (LOPRESSOR) 50 MG tablet Take 50 mg by mouth 2 (two) times daily.    [provider]  nitroGLYCERIN (NITROSTAT) 0.4 MG SL tablet Place under the tongue. 11/28/17   [provider]  Omega-3 Fatty Acids (FISH OIL) 1200 MG CAPS Take 1 capsule by mouth 2 (two) times daily.    [provider]  Spacer/Aero-Holding Chambers (AEROCHAMBER PLUS) inhaler Use as instructed 10/29/17   Domenick Gong, MD  warfarin (COUMADIN) 2 MG tablet Take 2 mg by mouth as directed.    [provider]  Family History History reviewed. No pertinent family history.  Social History Social History   Tobacco Use   Smoking status: Never   Smokeless tobacco: Never  Vaping Use   Vaping status: Never Used  Substance Use Topics   Alcohol use: Yes   Drug use: No     Allergies   Penicillins   Review of Systems Review of Systems  Constitutional:  Negative for fever.  Skin:  Positive for wound. Negative for color change.     Physical Exam Triage Vital Signs ED Triage Vitals [11/03/23 1022]  Encounter Vitals Group     BP      Systolic BP Percentile      Diastolic BP Percentile      Pulse      Resp      Temp      Temp src      SpO2      Weight 298 lb 15.1 oz  (135.6 kg)     Height 5\' 8"  (1.727 m)     Head Circumference      Peak Flow      Pain Score 0     Pain Loc      Pain Education      Exclude from Growth Chart    No data found.  Updated Vital Signs BP 135/64 (BP Location: Left Arm)   Pulse 66   Temp 97.8 F (36.6 C) (Oral)   Resp 15   Ht 5\' 8"  (1.727 m)   Wt 298 lb 15.1 oz (135.6 kg)   SpO2 98%   BMI 45.45 kg/m   Visual Acuity Right Eye Distance:   Left Eye Distance:   Bilateral Distance:    Right Eye Near:   Left Eye Near:    Bilateral Near:     Physical Exam Vitals and nursing note reviewed.  Constitutional:      Appearance: Normal appearance. He is not ill-appearing.  HENT:     Head: Normocephalic and atraumatic.  Musculoskeletal:        General: Signs of injury present.  Skin:    General: Skin is warm and dry.     Capillary Refill: Capillary refill takes less than 2 seconds.     Findings: No bruising or erythema.  Neurological:     General: No focal deficit present.     Mental Status: He is alert and oriented to person, place, and time.      UC Treatments / Results  Labs (all labs ordered are listed, but only abnormal results are displayed) Labs Reviewed - No data to display  EKG   Radiology No results found.  Procedures Procedures (including critical care time)  Medications Ordered in UC Medications  Tdap (BOOSTRIX) injection 0.5 mL (0.5 mLs Intramuscular Given 11/03/23 1047)    Initial Impression / Assessment and Plan / UC Course  I have reviewed the triage vital signs and the nursing notes.  Pertinent labs & imaging results that were available during my care of the patient were reviewed by me and considered in my medical decision making (see chart for details).   Patient is a pleasant, nontoxic-appearing 72 year old male presenting for evaluation of injury to the tip of his left toe that he sustained while clipping his toenails last night.  As you can see, near the toenail there is a  tissue defect with missing tissue at the tip of the left great toe.  There is continuous venous oozing from the wound.  Due to the pace  of the venous oozing I do not feel chemical cautery is a viable option.  I will attempt a pressure dressing with Gelfoam, Telfa, and Coban.  If unable to obtain hemostasis with this option I will refer the patient to the emergency department.  If hemostasis is able to be obtained I will discharge the patient home with a pressure dressing in place and have him change the dressing daily after first leaving on for 24 hours on interrupted.  I will start him on doxycycline 100 mg twice daily for 5 days as prophylactic treatment against infection.  I will also have staff take the patient's tetanus shot.  Patient has not blood through the Gelfoam and pressure dressing.  I will discharge him home on doxycycline.  He has ability to check his INR at home so I have asked him to monitor his INR closely and if he wants of supratherapeutic to stop the doxycycline and contact his PCP.  If he has a return of bleeding and lines of bleeding through the dressing he is to go to the ER as we have exhausted our options for achieving hemostasis at the urgent care.  Final Clinical Impressions(s) / UC Diagnoses   Final diagnoses:  Avulsion of toe, initial encounter     Discharge Instructions      Take the doxycycline twice daily with food for 5 days to prevent infection from your toe injury.  Monitor your INR at home and if you wind up supratherapeutic stop taking the doxycycline and contact your primary care provider.  Keep the pressure dressing in place for the next 24 hours.  You may then remove the dressing carefully, leaving the Gelfoam in place, wash your toe gently, and apply a new nonstick dressing.  Keep your left foot elevated is much as possible and avoid weightbearing is much as possible to prevent further bleeding.  If you have a return of significant bleeding through the  existing pressure dressing you need to go to the ER for evaluation.  If you develop any increased redness at the site of your toe, red streaks going up your toe, pus drainage, or fevers you need to either return for reevaluation or seek care in the ER.     ED Prescriptions     Medication Sig Dispense Auth. Provider   doxycycline (VIBRAMYCIN) 100 MG capsule Take 1 capsule (100 mg total) by mouth 2 (two) times daily for 5 days. 10 capsule Becky Augusta, NP      PDMP not reviewed this encounter.   Becky Augusta, NP 11/03/23 1049
